# Patient Record
Sex: Male | Born: 1940 | Hispanic: Yes | Marital: Married
Health system: Southern US, Community
[De-identification: ages and names within clinical notes are randomized; demographics above are authoritative.]

## PROBLEM LIST (undated history)

## (undated) DIAGNOSIS — I10 Essential (primary) hypertension: Secondary | ICD-10-CM

---

## 2006-11-04 ENCOUNTER — Ambulatory Visit (HOSPITAL_COMMUNITY): Admission: RE | Admit: 2006-11-04 | Discharge: 2006-11-04 | Payer: Self-pay | Admitting: Internal Medicine

## 2007-04-21 ENCOUNTER — Ambulatory Visit: Payer: Self-pay | Admitting: Gastroenterology

## 2007-04-21 ENCOUNTER — Encounter: Payer: Self-pay | Admitting: Gastroenterology

## 2007-04-21 ENCOUNTER — Ambulatory Visit (HOSPITAL_COMMUNITY): Admission: RE | Admit: 2007-04-21 | Discharge: 2007-04-21 | Payer: Self-pay | Admitting: Gastroenterology

## 2008-02-07 ENCOUNTER — Ambulatory Visit (HOSPITAL_COMMUNITY): Admission: RE | Admit: 2008-02-07 | Discharge: 2008-02-07 | Payer: Self-pay | Admitting: Family Medicine

## 2009-03-27 ENCOUNTER — Ambulatory Visit (HOSPITAL_COMMUNITY): Admission: RE | Admit: 2009-03-27 | Discharge: 2009-03-27 | Payer: Self-pay | Admitting: Family Medicine

## 2010-02-04 ENCOUNTER — Ambulatory Visit (HOSPITAL_COMMUNITY): Admission: RE | Admit: 2010-02-04 | Discharge: 2010-02-04 | Payer: Self-pay | Admitting: Family Medicine

## 2010-10-14 ENCOUNTER — Encounter (HOSPITAL_COMMUNITY): Payer: Self-pay

## 2010-10-14 ENCOUNTER — Other Ambulatory Visit (HOSPITAL_COMMUNITY): Payer: Self-pay | Admitting: Family Medicine

## 2010-10-14 ENCOUNTER — Ambulatory Visit (HOSPITAL_COMMUNITY)
Admission: RE | Admit: 2010-10-14 | Discharge: 2010-10-14 | Disposition: A | Discharge status: Home / Self Care | Payer: Medicare Other | Source: Ambulatory Visit | Attending: Family Medicine | Admitting: Family Medicine

## 2010-10-14 DIAGNOSIS — E785 Hyperlipidemia, unspecified: Secondary | ICD-10-CM

## 2010-10-14 DIAGNOSIS — J4489 Other specified chronic obstructive pulmonary disease: Secondary | ICD-10-CM | POA: Insufficient documentation

## 2010-10-14 DIAGNOSIS — I10 Essential (primary) hypertension: Secondary | ICD-10-CM

## 2010-10-14 DIAGNOSIS — E119 Type 2 diabetes mellitus without complications: Secondary | ICD-10-CM

## 2010-10-14 DIAGNOSIS — R059 Cough, unspecified: Secondary | ICD-10-CM | POA: Insufficient documentation

## 2010-10-14 DIAGNOSIS — R05 Cough: Secondary | ICD-10-CM | POA: Insufficient documentation

## 2010-10-14 DIAGNOSIS — J449 Chronic obstructive pulmonary disease, unspecified: Secondary | ICD-10-CM | POA: Insufficient documentation

## 2010-10-14 HISTORY — DX: Essential (primary) hypertension: I10

## 2010-11-10 NOTE — Op Note (Signed)
NAME:  Erik Cannon, Erik Cannon NO.:  [id number]   MEDICAL RECORD NO.:  [id number]          PATIENT TYPE:  AMB   LOCATION:  DAY                           FACILITY:  [facility]   PHYSICIAN:  Kassie Mends, M.D.      DATE OF BIRTH:  Nov 12, 1940   DATE OF PROCEDURE:  04/21/2007  DATE OF DISCHARGE:                               OPERATIVE REPORT   REFERRING PHYSICIAN:  Corrie Mckusick, M.D.   PROCEDURE:  Colonoscopy with cold forceps polypectomy.   INDICATION FOR EXAM:  Erik Cannon is a 70 year old male who presents for  average-risk colon cancer screening.   FINDINGS:  1. A proximal ascending colon diverticulosis.  Three to four 4-mm      sessile rectal polyps were removed via cold forceps.  Otherwise, no      masses, inflammatory changes or AVMs seen.  2. Moderate internal hemorrhoids.  Otherwise normal retroflexed view      of the rectum.   RECOMMENDATIONS:  1. Will call Erik Cannon with the results of his polypectomy.  If this      polyp is adenomatous, he should have a screening colonoscopy in 5      years.  2. He should follow a high fiber diet.  He is given a handout on high-      fiber diet, polyps, diverticulosis and hemorrhoids.  3. No aspirin, NSAIDs or anticoagulation for 5 days.   MEDICATIONS:  1. Demerol 50 mg IV.  2. Versed 6 mg IV.   PROCEDURE TECHNIQUE:  Physical exam was performed.  Informed consent was  obtained from the patient after explaining the benefits, risks and  alternatives to the procedure.  The patient was connected to the monitor  and placed in the left lateral position.  Continuous oxygen was provided  by nasal cannula and IV medicine administered through an indwelling  cannula.  After administration of sedation and rectal exam, the  patient's rectum was intubated  and the scope was advanced under direct visualization to the cecum.  The  scope was removed slowly by carefully examining the color, texture,  anatomy and integrity of the mucosa on  the way out.  The patient was  recovered in endoscopy and discharged home in satisfactory condition.      Kassie Mends, M.D.  Electronically Signed     SM/MEDQ  D:  04/21/2007  T:  04/22/2007  Job:  [id number]   cc:   Corrie Mckusick, M.D.  Fax: [phone]

## 2012-11-21 ENCOUNTER — Other Ambulatory Visit: Payer: Self-pay | Admitting: Cardiovascular Disease

## 2012-11-23 NOTE — Telephone Encounter (Signed)
[facility] patient.  Spoke w/ pt and confirmed he is taking two (2) 20mg  caps daily. Refill(s) sent to pharmacy.

## 2013-05-02 ENCOUNTER — Ambulatory Visit (HOSPITAL_COMMUNITY)
Admission: RE | Admit: 2013-05-02 | Discharge: 2013-05-02 | Disposition: A | Discharge status: Home / Self Care | Payer: Medicare Other | Source: Ambulatory Visit | Attending: Family Medicine | Admitting: Family Medicine

## 2013-05-02 ENCOUNTER — Other Ambulatory Visit (HOSPITAL_COMMUNITY): Payer: Self-pay | Admitting: Family Medicine

## 2013-05-02 DIAGNOSIS — J069 Acute upper respiratory infection, unspecified: Secondary | ICD-10-CM

## 2013-05-02 DIAGNOSIS — R059 Cough, unspecified: Secondary | ICD-10-CM | POA: Insufficient documentation

## 2013-05-02 DIAGNOSIS — Z87891 Personal history of nicotine dependence: Secondary | ICD-10-CM | POA: Insufficient documentation

## 2013-05-02 DIAGNOSIS — R0989 Other specified symptoms and signs involving the circulatory and respiratory systems: Secondary | ICD-10-CM | POA: Insufficient documentation

## 2013-05-02 DIAGNOSIS — R05 Cough: Secondary | ICD-10-CM | POA: Insufficient documentation

## 2013-05-02 DIAGNOSIS — R0602 Shortness of breath: Secondary | ICD-10-CM | POA: Insufficient documentation

## 2013-06-30 ENCOUNTER — Other Ambulatory Visit: Payer: Self-pay | Admitting: Cardiovascular Disease

## 2013-10-03 ENCOUNTER — Telehealth: Payer: Self-pay | Admitting: Gastroenterology

## 2013-10-03 NOTE — Telephone Encounter (Signed)
Patient's daughter Tonna Corner(Lily) called to see how to go about getting records transferred to doctor in [place]. Patient is being scheduled for another colonoscopy in the near future and are requesting his last [facility]/Path report from 2008 with [facility]. I have mailed them a release of information form to fill out and either fax back to us or the daughter said that she may FED EX it back to us. If there are any questions you can reach daughter at [phone]. I also gave her our mailing address [address] so there was no confusion about getting the release back. Patient's address is [address] [address] [place], [place] [id number]

## 2015-03-14 IMAGING — CR DG CHEST 2V
2 series · 2 of 2 positions shown · non-contrast
Comparison: Chest x-ray of 10/14/2010

CLINICAL DATA: Cough, congestion, shortness of breath for 2 weeks,
former smoking history

EXAM:
CHEST  2 VIEW

[view not recorded (1 of 2)]
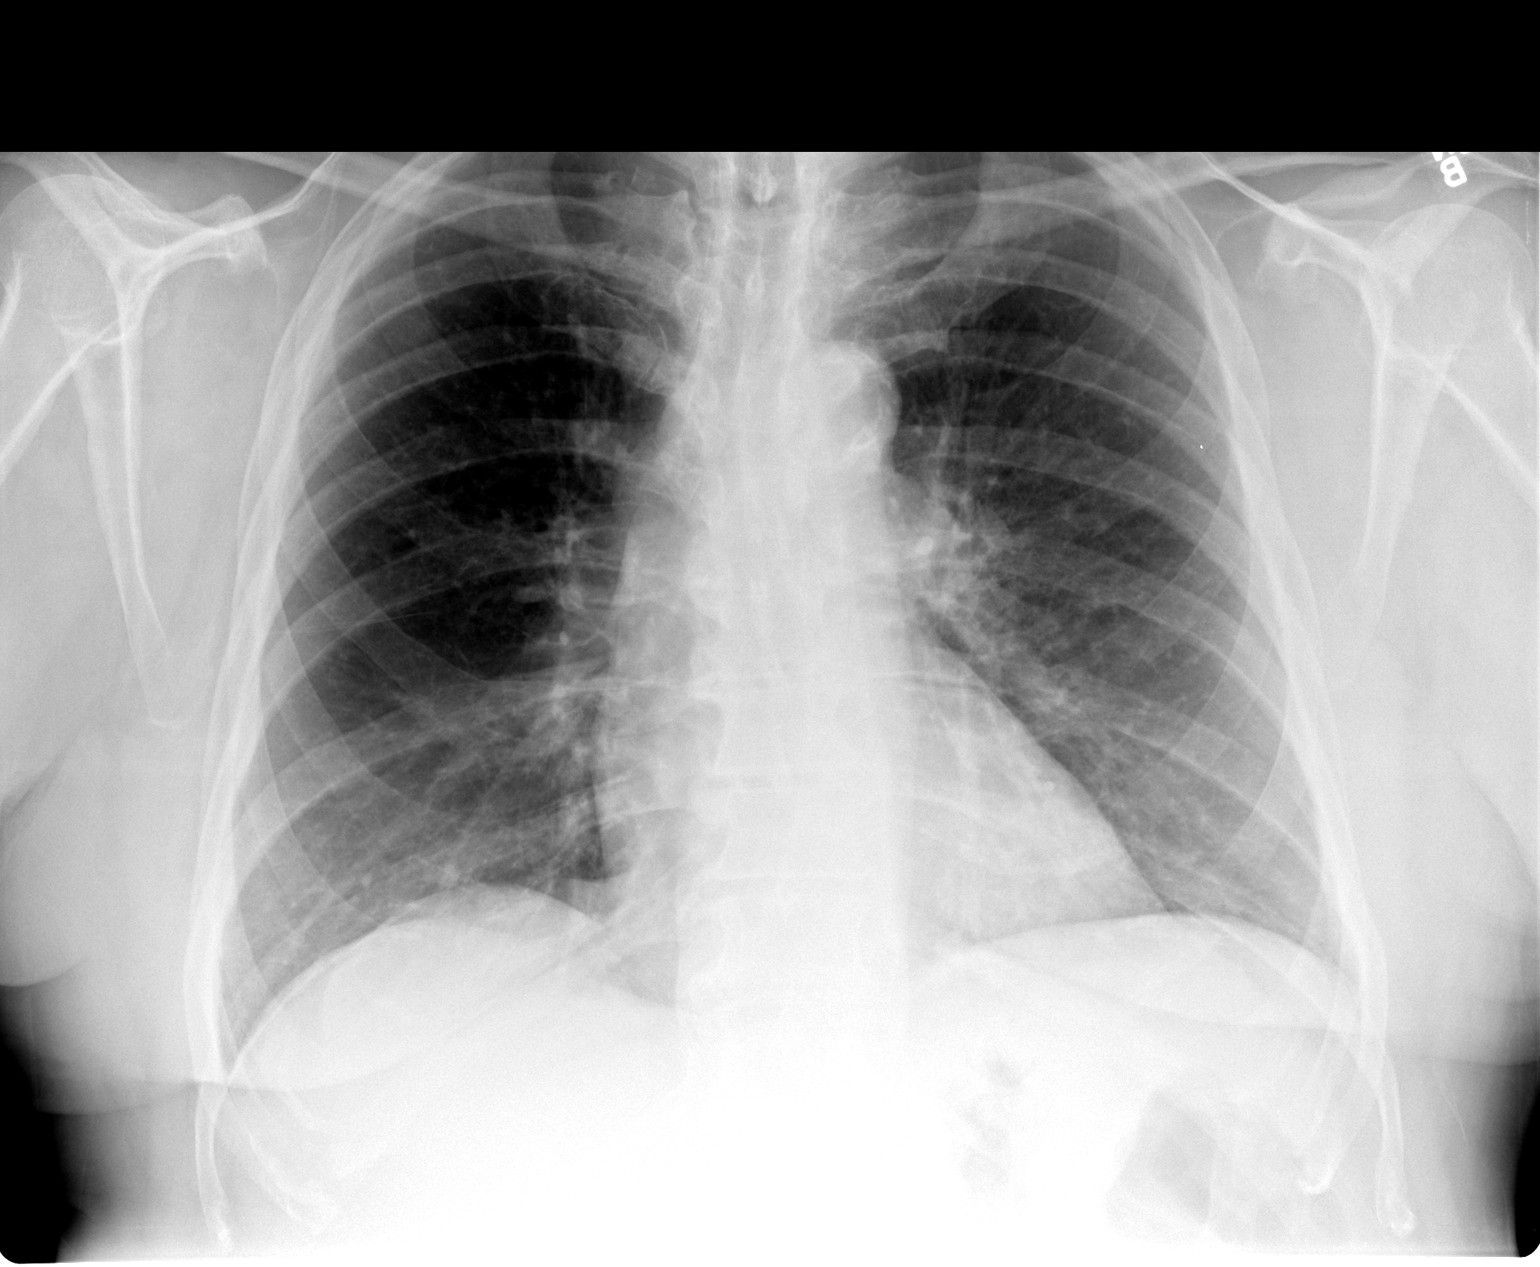

[view not recorded (2 of 2)]
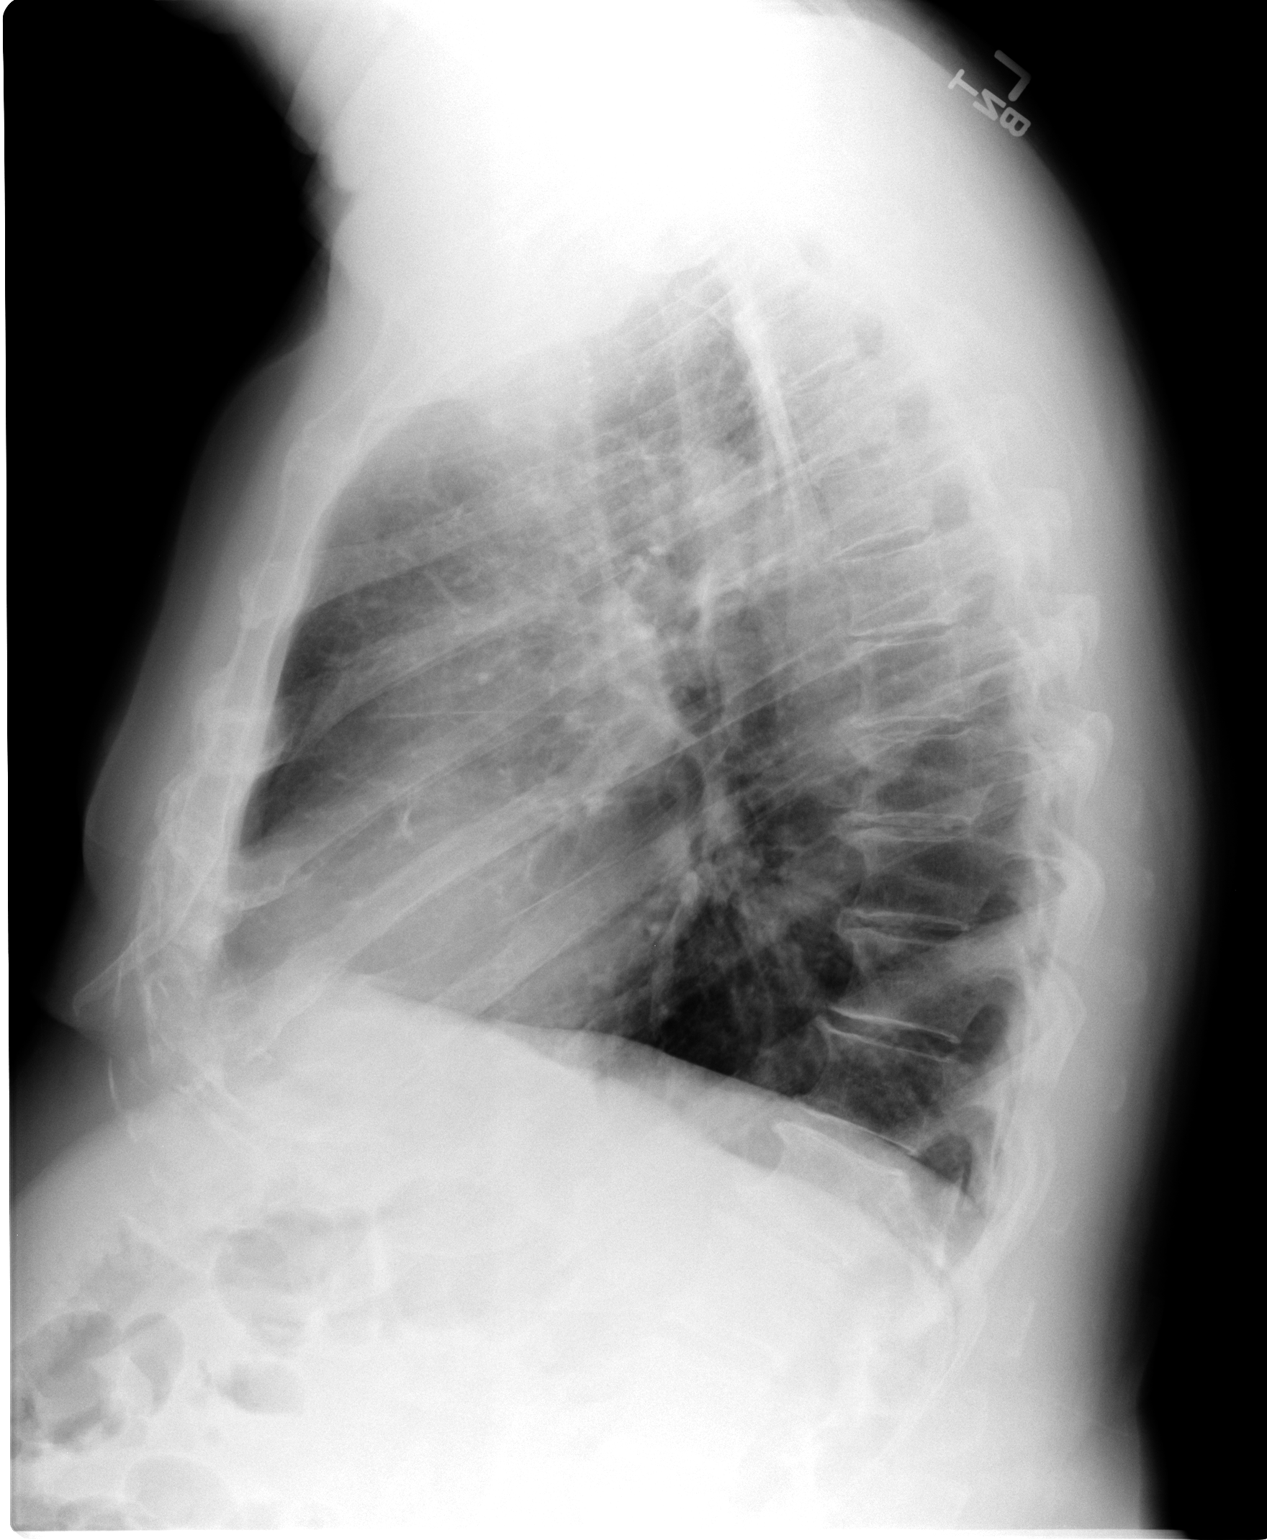

[2 of 2 positions shown; findings below may reference images not displayed]

FINDINGS: The lungs are clear but slightly hyperaerated. Mediastinal contours
appear stable. Minimal peribronchial thickening is noted. The heart
is borderline enlarged and stable. No acute bony abnormality is
seen.
IMPRESSION: No active lung disease. Stable mild hyper aeration and slight
peribronchial thickening.

## 2017-03-07 ENCOUNTER — Telehealth: Payer: Self-pay | Admitting: Gastroenterology

## 2017-03-07 NOTE — Telephone Encounter (Signed)
RECALL FOR TCS °

## 2017-03-07 NOTE — Telephone Encounter (Signed)
Letter mailed to pt.
